# Patient Record
Sex: Male | Born: 1985 | Race: Black or African American | Hispanic: No | Marital: Married | Smoking: Current every day smoker
Health system: Southern US, Community
[De-identification: ages and names within clinical notes are randomized; demographics above are authoritative.]

## PROBLEM LIST (undated history)

## (undated) DIAGNOSIS — R011 Cardiac murmur, unspecified: Secondary | ICD-10-CM

## (undated) DIAGNOSIS — M549 Dorsalgia, unspecified: Secondary | ICD-10-CM

## (undated) DIAGNOSIS — G8929 Other chronic pain: Secondary | ICD-10-CM

---

## 2005-01-16 ENCOUNTER — Ambulatory Visit: Payer: Self-pay | Admitting: Family Medicine

## 2010-01-06 ENCOUNTER — Emergency Department (HOSPITAL_COMMUNITY): Admission: EM | Admit: 2010-01-06 | Discharge: 2010-01-06 | Payer: Self-pay | Admitting: Emergency Medicine

## 2012-02-12 ENCOUNTER — Emergency Department (HOSPITAL_COMMUNITY): Payer: Self-pay

## 2012-02-12 ENCOUNTER — Emergency Department (HOSPITAL_COMMUNITY)
Admission: EM | Admit: 2012-02-12 | Discharge: 2012-02-12 | Disposition: A | Discharge status: Home / Self Care | Payer: Self-pay | Attending: Emergency Medicine | Admitting: Emergency Medicine

## 2012-02-12 ENCOUNTER — Encounter (HOSPITAL_COMMUNITY): Payer: Self-pay | Admitting: *Deleted

## 2012-02-12 DIAGNOSIS — S62306G Unspecified fracture of fifth metacarpal bone, right hand, subsequent encounter for fracture with delayed healing: Secondary | ICD-10-CM

## 2012-02-12 DIAGNOSIS — X838XXS Intentional self-harm by other specified means, sequela: Secondary | ICD-10-CM | POA: Insufficient documentation

## 2012-02-12 DIAGNOSIS — S42309S Unspecified fracture of shaft of humerus, unspecified arm, sequela: Secondary | ICD-10-CM | POA: Insufficient documentation

## 2012-02-12 DIAGNOSIS — M79609 Pain in unspecified limb: Secondary | ICD-10-CM | POA: Insufficient documentation

## 2012-02-12 MED ORDER — HYDROCODONE-ACETAMINOPHEN 5-325 MG PO TABS: 1.0000 | ORAL_TABLET | Freq: Once | ORAL | Status: AC

## 2012-02-12 MED ORDER — HYDROCODONE-ACETAMINOPHEN 5-325 MG PO TABS
1.0000 | ORAL_TABLET | Freq: Once | ORAL | Status: AC
  Administered 2012-02-12: 1 via ORAL
  Filled 2012-02-12: qty 1

## 2012-02-12 NOTE — Discharge Instructions (Signed)
You may use the medicine prescribed for pain,  But do not drive within 4 hours of taking as this will make you sleepy.  Call Dr Lajoyce Corners for definitive treatment of your hand injury.
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
## 2012-02-15 NOTE — ED Provider Notes (Signed)
Evaluation and management procedures were performed by the PA/NP/Resident Physician under my supervision/collaboration.   Felisa Bonier, MD 02/15/12 [phone]

## 2012-03-01 ENCOUNTER — Encounter (HOSPITAL_COMMUNITY): Payer: Self-pay | Admitting: *Deleted

## 2012-03-01 ENCOUNTER — Emergency Department (HOSPITAL_COMMUNITY)
Admission: EM | Admit: 2012-03-01 | Discharge: 2012-03-01 | Disposition: A | Discharge status: Home / Self Care | Payer: Self-pay | Attending: Emergency Medicine | Admitting: Emergency Medicine

## 2012-03-01 DIAGNOSIS — M79609 Pain in unspecified limb: Secondary | ICD-10-CM | POA: Insufficient documentation

## 2012-03-01 DIAGNOSIS — X58XXXS Exposure to other specified factors, sequela: Secondary | ICD-10-CM | POA: Insufficient documentation

## 2012-03-01 DIAGNOSIS — M25449 Effusion, unspecified hand: Secondary | ICD-10-CM | POA: Insufficient documentation

## 2012-03-01 DIAGNOSIS — S62308A Unspecified fracture of other metacarpal bone, initial encounter for closed fracture: Secondary | ICD-10-CM

## 2012-03-01 MED ORDER — IBUPROFEN 800 MG PO TABS: 800.0000 mg | ORAL_TABLET | Freq: Three times a day (TID) | ORAL | Status: AC

## 2012-03-01 MED ORDER — HYDROCODONE-ACETAMINOPHEN 5-325 MG PO TABS: 1.0000 | ORAL_TABLET | ORAL | Status: AC | PRN
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
## 2012-03-01 NOTE — ED Notes (Signed)
Pt seen by EDPa for initial assessment. 

## 2012-03-01 NOTE — ED Notes (Signed)
Rt hand pain for 2 mos , seen here for same.Says it "did not heal right"

## 2012-03-01 NOTE — Discharge Instructions (Signed)
Hand Fracture Your caregiver has diagnosed you with a fractured (broken) bone in your hand. If the bones are in good position and the hand is properly immobilized and rested, these injuries will usually heal in 3 to 6 weeks. A cast, splint, or bulky bandage is usually applied to keep the fracture site from moving. Do not remove the splint or cast until your caregiver approves. If the fracture is unstable or the bones are not aligned properly, surgery may be needed. Keep your hand raised (elevated) above the level of your heart as much as possible for the next 2 to 3 days until the swelling and pain are better. Apply ice packs for 15 to 20 minutes every 3 to 4 hours to help control the pain and swelling. See your caregiver or an orthopedic specialist as directed for follow-up care to make sure the fracture is beginning to heal properly. SEEK IMMEDIATE MEDICAL CARE IF:   You notice your fingers are cold, numb, crooked, or the pain of your injury is severe.   You are not improving or seem to be getting worse.   You have questions or concerns.  Document Released: 12/04/2004 Document Revised: 10/16/2011 Document Reviewed: 04/24/2009 [facility] Patient Information 2012 [place], [place].  Wear the finger splint for comfort. You may use the medication prescribed for your hand pain.  Do not drive within 4 taking this medication.  Plan to see Dr. Lajoyce Corners  on the 29th as scheduled.

## 2012-03-01 NOTE — ED Provider Notes (Signed)
Medical screening examination/treatment/procedure(s) were performed by non-physician practitioner and as supervising physician I was immediately available for consultation/collaboration.  Shelda Jakes, MD 03/01/12 [phone]

## 2012-03-25 ENCOUNTER — Encounter (HOSPITAL_COMMUNITY): Payer: Self-pay | Admitting: *Deleted

## 2012-03-25 ENCOUNTER — Emergency Department (HOSPITAL_COMMUNITY)
Admission: EM | Admit: 2012-03-25 | Discharge: 2012-03-25 | Disposition: A | Discharge status: Home / Self Care | Payer: Self-pay | Attending: Emergency Medicine | Admitting: Emergency Medicine

## 2012-03-25 DIAGNOSIS — M79609 Pain in unspecified limb: Secondary | ICD-10-CM | POA: Insufficient documentation

## 2012-03-25 DIAGNOSIS — G8929 Other chronic pain: Secondary | ICD-10-CM | POA: Insufficient documentation

## 2012-03-25 MED ORDER — HYDROCODONE-ACETAMINOPHEN 5-325 MG PO TABS
2.0000 | ORAL_TABLET | Freq: Once | ORAL | Status: AC
  Administered 2012-03-25: 2 via ORAL
  Filled 2012-03-25: qty 2

## 2012-03-25 MED ORDER — IBUPROFEN 800 MG PO TABS
800.0000 mg | ORAL_TABLET | Freq: Once | ORAL | Status: AC
  Administered 2012-03-25: 800 mg via ORAL
  Filled 2012-03-25: qty 1

## 2012-03-25 MED ORDER — ONDANSETRON HCL 4 MG PO TABS
4.0000 mg | ORAL_TABLET | Freq: Once | ORAL | Status: AC
  Administered 2012-03-25: 4 mg via ORAL
  Filled 2012-03-25: qty 1

## 2012-03-25 NOTE — Discharge Instructions (Signed)
Your vital signs are stable. There no new changes on your examination at this time. Please call the medical social worker here at [facility]  on tomorrow for possible assistance getting to see a specialist. Your may also want to consult the [facility].

## 2012-03-25 NOTE — ED Notes (Signed)
Pt states old injury from October. Tendons were cut and repaired at [facility]. Was suppose to see a surgeon but hasn't been able to due to finances.

## 2012-03-25 NOTE — ED Provider Notes (Signed)
History     CSN: [id number]  Arrival date & time 03/25/12  1634   First MD Initiated Contact with Patient 03/25/12 1746      Chief Complaint  Patient presents with  . Hand Pain    (Consider location/radiation/quality/duration/timing/severity/associated sxs/prior treatment) HPI Comments: Patient states he has a history of a fracture of the right fifth finger of the hand. The area did not heal correctly, and he has chronic pain with this pain. He has a problem flexing and extending the fourth and fifth fingers. He states that he has pain that wakes him up at times. He presents to the emergency department for assistance with his pain. It is of note that the patient was seen twice in the month of April for problems with his pain. He was initially referred to the orthopedic physician here in town, and they referred him to the orthopedic physician in [place] where he was initially seen. The patient states he missed that appointment because of transportation issues. The patient was then referred to Dr. Lajoyce Corners in [place], but he missed this appointment as well. States that he did not have the money to see this particular physician. The patient presents again tonight for assistance with his pain management.  The history is provided by the patient.    History reviewed. No pertinent past medical history.  History reviewed. No pertinent past surgical history.  No family history on file.  History  Substance Use Topics  . Smoking status: Current Everyday Smoker    Types: Cigarettes  . Smokeless tobacco: Not on file  . Alcohol Use: No      Review of Systems  Constitutional: Negative for activity change.       All ROS Neg except as noted in HPI  HENT: Negative for nosebleeds and neck pain.   Eyes: Negative for photophobia and discharge.  Respiratory: Negative for cough, shortness of breath and wheezing.   Cardiovascular: Negative for chest pain and palpitations.  Gastrointestinal:  Negative for abdominal pain and blood in stool.  Genitourinary: Negative for dysuria, frequency and hematuria.  Musculoskeletal: Negative for back pain and arthralgias.       Hand pain  Skin: Negative.   Neurological: Negative for dizziness, seizures and speech difficulty.  Psychiatric/Behavioral: Negative for hallucinations and confusion.    Allergies  Review of patient's allergies indicates no known allergies.  Home Medications   Current Outpatient Rx  Name Route Sig Dispense Refill  . IBUPROFEN 200 MG PO TABS Oral Take 800 mg by mouth daily as needed. For pain      BP 119/70  Pulse 71  Temp(Src) 97.4 F (36.3 C) (Oral)  Resp 18  Ht 5\' 9"  (1.753 m)  Wt 140 lb (63.504 kg)  BMI 20.67 kg/m2  SpO2 100%  Physical Exam  Nursing note and vitals reviewed. Constitutional: He is oriented to person, place, and time. He appears well-developed and well-nourished.  Non-toxic appearance.  HENT:  Head: Normocephalic.  Right Ear: Tympanic membrane and external ear normal.  Left Ear: Tympanic membrane and external ear normal.  Eyes: EOM and lids are normal. Pupils are equal, round, and reactive to light.  Neck: Normal range of motion. Neck supple. Carotid bruit is not present.  Cardiovascular: Normal rate, regular rhythm, normal heart sounds, intact distal pulses and normal pulses.   Pulmonary/Chest: Breath sounds normal. No respiratory distress.  Abdominal: Soft. Bowel sounds are normal. There is no tenderness. There is no guarding.  Musculoskeletal: Normal range of motion.  There is decrease flexion of the right fourth and fifth fingers. The patient particularly has problems making a fist. There is no hot joints noted of the right hand. There is no significant swelling present. There is tenderness to palpation over the fourth and fifth MCP. There is good capillary refill and sensory.  Lymphadenopathy:       Head (right side): No submandibular adenopathy present.       Head (left  side): No submandibular adenopathy present.    He has no cervical adenopathy.  Neurological: He is alert and oriented to person, place, and time. He has normal strength. No cranial nerve deficit or sensory deficit.  Skin: Skin is warm and dry.  Psychiatric: He has a normal mood and affect. His speech is normal.    ED Course  Procedures (including critical care time)  Labs Reviewed - No data to display No results found.   No diagnosis found.    MDM  I have reviewed nursing notes, vital signs, and all appropriate lab and imaging results for this patient. Patient has chronic hand pain related to a bowel healing fracture of the right hand. He has been seen in the emergency department multiple times for problems with pain of his hand. He has been referred to orthopedics in [place], as well as orthopedics in [place] for his pain, but has not been able to make these appointments. The plan at this time. Will be to give the patient. Medication for his pain tonight while here in the emergency department. He's been advised to see the medical social worker to the hospital for assistance to see the consultant. The patient has also been advised to see the [facility] for help with his pain.       Kathie Dike, [place] 03/25/12 1902

## 2012-03-25 NOTE — ED Notes (Signed)
C/o pain to right hand x 1 month.  Seen here for same.  Pt unable to f/u with orthopedic.  C/o pain, out of pain meds.

## 2012-03-25 NOTE — ED Provider Notes (Signed)
Medical screening examination/treatment/procedure(s) were performed by non-physician practitioner and as supervising physician I was immediately available for consultation/collaboration.  Donnetta Hutching, MD 03/25/12 2153

## 2012-08-05 ENCOUNTER — Emergency Department (HOSPITAL_COMMUNITY)
Admission: EM | Admit: 2012-08-05 | Discharge: 2012-08-05 | Disposition: A | Discharge status: Home / Self Care | Payer: Medicaid Other | Attending: Emergency Medicine | Admitting: Emergency Medicine

## 2012-08-05 ENCOUNTER — Encounter (HOSPITAL_COMMUNITY): Payer: Self-pay | Admitting: Emergency Medicine

## 2012-08-05 DIAGNOSIS — F17200 Nicotine dependence, unspecified, uncomplicated: Secondary | ICD-10-CM | POA: Insufficient documentation

## 2012-08-05 DIAGNOSIS — K0889 Other specified disorders of teeth and supporting structures: Secondary | ICD-10-CM

## 2012-08-05 DIAGNOSIS — K029 Dental caries, unspecified: Secondary | ICD-10-CM | POA: Insufficient documentation

## 2012-08-05 MED ORDER — PENICILLIN V POTASSIUM 250 MG PO TABS
500.0000 mg | ORAL_TABLET | Freq: Once | ORAL | Status: AC
  Administered 2012-08-05: 500 mg via ORAL
  Filled 2012-08-05: qty 2

## 2012-08-05 MED ORDER — IBUPROFEN 800 MG PO TABS
800.0000 mg | ORAL_TABLET | Freq: Once | ORAL | Status: AC
  Administered 2012-08-05: 800 mg via ORAL
  Filled 2012-08-05: qty 1

## 2012-08-05 MED ORDER — HYDROCODONE-ACETAMINOPHEN 5-325 MG PO TABS
1.0000 | ORAL_TABLET | Freq: Once | ORAL | Status: AC
  Administered 2012-08-05: 1 via ORAL
  Filled 2012-08-05: qty 1

## 2012-08-05 MED ORDER — HYDROCODONE-ACETAMINOPHEN 5-325 MG PO TABS: 1.0000 | ORAL_TABLET | Freq: Four times a day (QID) | ORAL | Status: AC | PRN

## 2012-08-05 MED ORDER — PENICILLIN V POTASSIUM 500 MG PO TABS: 500.0000 mg | ORAL_TABLET | Freq: Four times a day (QID) | ORAL | Status: AC

## 2012-08-05 NOTE — ED Provider Notes (Signed)
History     CSN: [id number]  Arrival date & time 08/05/12  1539   First MD Initiated Contact with Patient 08/05/12 1614      Chief Complaint  Patient presents with  . Dental Pain    (Consider location/radiation/quality/duration/timing/severity/associated sxs/prior treatment) Patient is a 26 y.o. male presenting with tooth pain. The history is provided by the patient. No language interpreter was used.  Dental PainThe primary symptoms include mouth pain. Episode onset: 2 weeks ago. The symptoms are unchanged. The symptoms occur constantly.  Additional symptoms do not include: purulent gums, trismus and facial swelling.    History reviewed. No pertinent past medical history.  History reviewed. No pertinent past surgical history.  History reviewed. No pertinent family history.  History  Substance Use Topics  . Smoking status: Current Every Day Smoker -- 1.0 packs/day    Types: Cigarettes  . Smokeless tobacco: Not on file  . Alcohol Use: No      Review of Systems  HENT: Positive for dental problem. Negative for facial swelling.   All other systems reviewed and are negative.    Allergies  Review of patient's allergies indicates no known allergies.  Home Medications   Current Outpatient Rx  Name Route Sig Dispense Refill  . HYDROCODONE-ACETAMINOPHEN 5-325 MG PO TABS Oral Take 1 tablet by mouth every 6 (six) hours as needed for pain. 20 tablet 0  . PENICILLIN V POTASSIUM 500 MG PO TABS Oral Take 1 tablet (500 mg total) by mouth 4 (four) times daily. 40 tablet 0    BP 122/73  Pulse 95  Temp 98.6 F (37 C) (Oral)  Resp 20  Ht 5\' 8"  (1.727 m)  Wt 145 lb (65.772 kg)  BMI 22.05 kg/m2  SpO2 100%  Physical Exam  Nursing note and vitals reviewed. Constitutional: He is oriented to person, place, and time. He appears well-developed and well-nourished.  HENT:  Head: Normocephalic and atraumatic.  Mouth/Throat: Uvula is midline, oropharynx is clear and moist and mucous  membranes are normal. Dental caries present. No dental abscesses or uvula swelling.    Eyes: EOM are normal.  Neck: Normal range of motion.  Cardiovascular: Normal rate, regular rhythm, normal heart sounds and intact distal pulses.   Pulmonary/Chest: Effort normal and breath sounds normal. No respiratory distress.  Abdominal: Soft. He exhibits no distension. There is no tenderness.  Musculoskeletal: Normal range of motion.  Neurological: He is alert and oriented to person, place, and time.  Skin: Skin is warm and dry.  Psychiatric: He has a normal mood and affect. Judgment normal.    ED Course  Procedures (including critical care time)  Labs Reviewed - No data to display No results found.   1. Pain, dental       MDM  rx-pen VK 500 mg, 40 rx-hydrocodone ibuprofen        Evalina Field, [place] 08/05/12 1631

## 2012-08-05 NOTE — ED Notes (Signed)
Patient with no complaints at this time. Respirations even and unlabored. Skin warm/dry. Discharge instructions reviewed with patient at this time. Patient given opportunity to voice concerns/ask questions. Patient discharged at this time and left Emergency Department with steady gait.   

## 2012-08-05 NOTE — ED Notes (Signed)
Pt c/o dental pain x 3 days.

## 2012-08-07 NOTE — ED Provider Notes (Signed)
Medical screening examination/treatment/procedure(s) were performed by non-physician practitioner and as supervising physician I was immediately available for consultation/collaboration.  Flint Melter, MD 08/07/12 [phone]

## 2013-02-02 ENCOUNTER — Encounter (HOSPITAL_COMMUNITY): Payer: Self-pay | Admitting: Emergency Medicine

## 2013-02-02 ENCOUNTER — Emergency Department (HOSPITAL_COMMUNITY)
Admission: EM | Admit: 2013-02-02 | Discharge: 2013-02-02 | Disposition: A | Discharge status: Home / Self Care | Payer: Medicaid Other | Attending: Emergency Medicine | Admitting: Emergency Medicine

## 2013-02-02 DIAGNOSIS — F17200 Nicotine dependence, unspecified, uncomplicated: Secondary | ICD-10-CM | POA: Insufficient documentation

## 2013-02-02 DIAGNOSIS — J029 Acute pharyngitis, unspecified: Secondary | ICD-10-CM

## 2013-02-02 DIAGNOSIS — J3489 Other specified disorders of nose and nasal sinuses: Secondary | ICD-10-CM | POA: Insufficient documentation

## 2013-02-02 DIAGNOSIS — R112 Nausea with vomiting, unspecified: Secondary | ICD-10-CM

## 2013-02-02 DIAGNOSIS — R509 Fever, unspecified: Secondary | ICD-10-CM | POA: Insufficient documentation

## 2013-02-02 LAB — RAPID STREP SCREEN (MED CTR MEBANE ONLY): Streptococcus, Group A Screen (Direct): NEGATIVE

## 2013-02-02 MED ORDER — HYDROCODONE-ACETAMINOPHEN 5-325 MG PO TABS: 1.0000 | ORAL_TABLET | ORAL | Status: DC | PRN

## 2013-02-02 MED ORDER — PROMETHAZINE HCL 25 MG PO TABS: 25.0000 mg | ORAL_TABLET | Freq: Four times a day (QID) | ORAL | Status: DC | PRN

## 2013-02-02 MED ORDER — HYDROCODONE-ACETAMINOPHEN 5-325 MG PO TABS
1.0000 | ORAL_TABLET | Freq: Once | ORAL | Status: AC
  Administered 2013-02-02: 1 via ORAL
  Filled 2013-02-02: qty 1

## 2013-02-02 MED ORDER — ONDANSETRON 8 MG PO TBDP
8.0000 mg | ORAL_TABLET | Freq: Once | ORAL | Status: AC
  Administered 2013-02-02: 8 mg via ORAL
  Filled 2013-02-02: qty 1

## 2013-02-02 NOTE — ED Notes (Signed)
Symptoms began yesterday. Motrin last night

## 2013-02-02 NOTE — ED Provider Notes (Signed)
History     CSN: [id number]  Arrival date & time 02/02/13  1100   First MD Initiated Contact with Patient 02/02/13 1108      Chief Complaint  Patient presents with  . Sore Throat  . Fever  . Nasal Congestion    (Consider location/radiation/quality/duration/timing/severity/associated sxs/prior treatment) HPI Timothy Hawkins is a 27 y.o. male who presents to the ED with sore throat. This is a new problem.The sore throat started last night after he vomited x 3. He has not vomited today, has a little nausea. Associated symptoms include fever, chills, aching all over. The history was provided by the patient.    History reviewed. No pertinent past medical history.  History reviewed. No pertinent past surgical history.  History reviewed. No pertinent family history.  History  Substance Use Topics  . Smoking status: Current Every Day Smoker -- 1.00 packs/day    Types: Cigarettes  . Smokeless tobacco: Not on file  . Alcohol Use: No      Review of Systems  Constitutional: Positive for fever and chills.  HENT: Positive for sore throat. Negative for neck pain and neck stiffness.   Respiratory: Negative for cough.   Gastrointestinal: Positive for nausea and vomiting.  Musculoskeletal: Negative for back pain.  Skin: Negative for rash.  Neurological: Negative for dizziness and headaches.  Psychiatric/Behavioral: Negative for confusion. The patient is not nervous/anxious.     Allergies  Review of patient's allergies indicates no known allergies.  Home Medications   Current Outpatient Rx  Name  Route  Sig  Dispense  Refill  . ibuprofen (ADVIL,MOTRIN) 200 MG tablet   Oral   Take 400 mg by mouth every 6 (six) hours as needed for pain.           BP 124/75  Pulse 87  Temp(Src) 98.1 F (36.7 C) (Oral)  Resp 18  Ht 5\' 8"  (1.727 m)  Wt 135 lb (61.236 kg)  BMI 20.53 kg/m2  SpO2 100%  Physical Exam  Nursing note and vitals reviewed. Constitutional: He is oriented to  person, place, and time. He appears well-developed and well-nourished. No distress.  HENT:  Head: Normocephalic.  Right Ear: Tympanic membrane normal.  Left Ear: Tympanic membrane normal.  Nose: Nose normal.  Mouth/Throat: Uvula is midline and mucous membranes are normal. Posterior oropharyngeal erythema present.  Eyes: Conjunctivae and EOM are normal. Pupils are equal, round, and reactive to light.  Neck: Normal range of motion. Neck supple.  Cardiovascular: Normal rate and regular rhythm.   Pulmonary/Chest: Effort normal and breath sounds normal.  Abdominal: Soft. There is no tenderness.  Musculoskeletal: Normal range of motion. He exhibits no edema.  Neurological: He is alert and oriented to person, place, and time. No cranial nerve deficit.  Skin: Skin is warm and dry.  Psychiatric: He has a normal mood and affect. His behavior is normal. Judgment and thought content normal.   Results for orders placed during the hospital encounter of 02/02/13 (from the past 24 hour(s))  RAPID STREP SCREEN     Status: None   Collection Time    02/02/13 11:05 AM      Result Value Range   Streptococcus, Group A Screen (Direct) NEGATIVE  NEGATIVE    Assessment: 27 y.o. male with sore throat   Nausea and vomiting  Plan:  Treat nausea   Tylenol or motrin for fever  Patient feeling better after Zofran.    Will d/c home with medication for nausea and he will take tylenol  or motrin for aching and fever.  Discussed with the patient and all questioned fully answered. He will return if any problems arise.    Medication List    TAKE these medications       HYDROcodone-acetaminophen 5-325 MG per tablet  Commonly known as:  NORCO/VICODIN  Take 1 tablet by mouth every 4 (four) hours as needed.     promethazine 25 MG tablet  Commonly known as:  PHENERGAN  Take 1 tablet (25 mg total) by mouth every 6 (six) hours as needed for nausea.      ASK your doctor about these medications       ibuprofen  200 MG tablet  Commonly known as:  ADVIL,MOTRIN  Take 400 mg by mouth every 6 (six) hours as needed for pain.         ED Course  Procedures      [facility], NP 02/03/13 [phone]

## 2013-02-04 NOTE — ED Provider Notes (Signed)
Medical screening examination/treatment/procedure(s) were performed by non-physician practitioner and as supervising physician I was immediately available for consultation/collaboration.   Lyanne Co, MD 02/04/13 [phone]

## 2013-04-25 ENCOUNTER — Emergency Department (HOSPITAL_COMMUNITY)
Admission: EM | Admit: 2013-04-25 | Discharge: 2013-04-25 | Disposition: A | Discharge status: Home / Self Care | Payer: Medicaid Other | Attending: Emergency Medicine | Admitting: Emergency Medicine

## 2013-04-25 ENCOUNTER — Encounter (HOSPITAL_COMMUNITY): Payer: Self-pay | Admitting: *Deleted

## 2013-04-25 ENCOUNTER — Emergency Department (HOSPITAL_COMMUNITY): Payer: Medicaid Other

## 2013-04-25 DIAGNOSIS — S93401A Sprain of unspecified ligament of right ankle, initial encounter: Secondary | ICD-10-CM

## 2013-04-25 DIAGNOSIS — K029 Dental caries, unspecified: Secondary | ICD-10-CM

## 2013-04-25 DIAGNOSIS — X58XXXA Exposure to other specified factors, initial encounter: Secondary | ICD-10-CM | POA: Insufficient documentation

## 2013-04-25 DIAGNOSIS — Y9367 Activity, basketball: Secondary | ICD-10-CM | POA: Insufficient documentation

## 2013-04-25 DIAGNOSIS — K089 Disorder of teeth and supporting structures, unspecified: Secondary | ICD-10-CM | POA: Insufficient documentation

## 2013-04-25 DIAGNOSIS — Y92838 Other recreation area as the place of occurrence of the external cause: Secondary | ICD-10-CM | POA: Insufficient documentation

## 2013-04-25 DIAGNOSIS — S93409A Sprain of unspecified ligament of unspecified ankle, initial encounter: Secondary | ICD-10-CM | POA: Insufficient documentation

## 2013-04-25 DIAGNOSIS — F17200 Nicotine dependence, unspecified, uncomplicated: Secondary | ICD-10-CM | POA: Insufficient documentation

## 2013-04-25 DIAGNOSIS — Y9239 Other specified sports and athletic area as the place of occurrence of the external cause: Secondary | ICD-10-CM | POA: Insufficient documentation

## 2013-04-25 MED ORDER — IBUPROFEN 600 MG PO TABS: 600.0000 mg | ORAL_TABLET | Freq: Four times a day (QID) | ORAL | Status: DC | PRN

## 2013-04-25 MED ORDER — HYDROCODONE-ACETAMINOPHEN 5-325 MG PO TABS: 1.0000 | ORAL_TABLET | ORAL | Status: DC | PRN

## 2013-04-25 MED ORDER — AMOXICILLIN 500 MG PO CAPS: 500.0000 mg | ORAL_CAPSULE | Freq: Three times a day (TID) | ORAL | Status: DC

## 2013-04-25 NOTE — ED Notes (Signed)
Pt with right upper and left lower jaw pain due to dental problems off and on for weeks per pt.

## 2013-04-25 NOTE — ED Notes (Signed)
Pt reporting "teeth hurt".  Non-specific regarding location. Reports pain in upper and lower jaw on both sides.

## 2013-04-25 NOTE — ED Provider Notes (Signed)
History     CSN: [id number]  Arrival date & time 04/25/13  Paulo Fruit   First MD Initiated Contact with Patient 04/25/13 1952      Chief Complaint  Patient presents with  . Dental Pain    (Consider location/radiation/quality/duration/timing/severity/associated sxs/prior treatment) HPI Timothy Hawkins is a 27 y.o. male who presents tot he ED with dental pain. The pain is located in the upper right and lower left. He has a history of dental decay but has not been able to see a dentist on a regular basis. He also complains of swelling and pain in the right ankle after a fall while playing basketball 3 days ago. The pain is on the inner aspect of the ankle. The history was provided by the patient.    History reviewed. No pertinent past medical history.  History reviewed. No pertinent past surgical history.  History reviewed. No pertinent family history.  History  Substance Use Topics  . Smoking status: Current Every Day Smoker -- 1.00 packs/day    Types: Cigarettes  . Smokeless tobacco: Not on file  . Alcohol Use: No      Review of Systems  Constitutional: Negative for fever and chills.  HENT: Positive for dental problem. Negative for neck pain.   Cardiovascular: Negative for chest pain.  Gastrointestinal: Negative for nausea, vomiting and abdominal pain.  Musculoskeletal:       Right ankle pain  Skin: Wound: abrasion right ankle.  Neurological: Negative for headaches.  Psychiatric/Behavioral: The patient is not nervous/anxious.     Allergies  Review of patient's allergies indicates no known allergies.  Home Medications   Current Outpatient Rx  Name  Route  Sig  Dispense  Refill  . HYDROcodone-acetaminophen (NORCO/VICODIN) 5-325 MG per tablet   Oral   Take 1 tablet by mouth every 4 (four) hours as needed.   10 tablet   0   . ibuprofen (ADVIL,MOTRIN) 200 MG tablet   Oral   Take 400 mg by mouth every 6 (six) hours as needed for pain.         . promethazine  (PHENERGAN) 25 MG tablet   Oral   Take 1 tablet (25 mg total) by mouth every 6 (six) hours as needed for nausea.   20 tablet   0     BP 123/84  Pulse 67  Temp(Src) 98.6 F (37 C) (Oral)  Resp 24  Ht 5\' 8"  (1.727 m)  Wt 135 lb (61.236 kg)  BMI 20.53 kg/m2  SpO2 100%  Physical Exam  Nursing note and vitals reviewed. Constitutional: He is oriented to person, place, and time. He appears well-developed and well-nourished. No distress.  HENT:  Right Ear: Tympanic membrane normal.  Left Ear: Tympanic membrane normal.  Nose: Nose normal.  Mouth/Throat: Uvula is midline, oropharynx is clear and moist and mucous membranes are normal. Dental caries present.    Eyes: EOM are normal.  Neck: Neck supple.  Pulmonary/Chest: Effort normal.  Abdominal: Soft. There is no tenderness.  Musculoskeletal:       Right ankle: He exhibits decreased range of motion and swelling. He exhibits no deformity. Tenderness. Medial malleolus tenderness found. Achilles tendon normal.  Neurological: He is alert and oriented to person, place, and time. No cranial nerve deficit.  Skin: Skin is warm and dry.   Dg Ankle Complete Right  04/25/2013   *RADIOLOGY REPORT*  Clinical Data: Post fall 2 days ago, now with diffuse right ankle swelling  RIGHT ANKLE - COMPLETE 3+ VIEW  Comparison: None.  Findings:  No fracture or dislocation.  The ankle mortise appears preserved. No ankle joint effusion.  Regional soft tissues appear normal.  No radiopaque foreign body.  Possible small plantar calcaneal spur.  IMPRESSION: No fracture or dislocation.   Original Report Authenticated By: Tacey Ruiz, MD     ED Course  Procedures (including critical care time)   MDM  27 y.o. male with dental pain due to caries and right ankle sprain. Will treat with antibiotics and pain medication for the dental problem. Will apply ASO and give patient NSAIDS for the ankle sprain.  I have reviewed this patient's vital signs, nurses notes,  appropriate labs and imaging.  I have discussed findings with the patient and plan of care. Patient voices understanding.     Medication List    STOP taking these medications       promethazine 25 MG tablet  Commonly known as:  PHENERGAN      TAKE these medications       amoxicillin 500 MG capsule  Commonly known as:  AMOXIL  Take 1 capsule (500 mg total) by mouth 3 (three) times daily.     HYDROcodone-acetaminophen 5-325 MG per tablet  Commonly known as:  NORCO/VICODIN  Take 1 tablet by mouth every 4 (four) hours as needed.     ibuprofen 600 MG tablet  Commonly known as:  ADVIL,MOTRIN  Take 1 tablet (600 mg total) by mouth every 6 (six) hours as needed for pain.              [place], [place] 04/25/13 [phone]

## 2013-04-26 NOTE — ED Provider Notes (Signed)
Medical screening examination/treatment/procedure(s) were performed by non-physician practitioner and as supervising physician I was immediately available for consultation/collaboration.    Quintavious Rinck D Korene Dula, MD 04/26/13 1101 

## 2013-06-17 ENCOUNTER — Emergency Department (HOSPITAL_COMMUNITY): Payer: Medicaid Other

## 2013-06-17 ENCOUNTER — Emergency Department (HOSPITAL_COMMUNITY)
Admission: EM | Admit: 2013-06-17 | Discharge: 2013-06-18 | Disposition: A | Discharge status: Home / Self Care | Payer: Medicaid Other | Attending: Emergency Medicine | Admitting: Emergency Medicine

## 2013-06-17 ENCOUNTER — Encounter (HOSPITAL_COMMUNITY): Payer: Self-pay | Admitting: *Deleted

## 2013-06-17 DIAGNOSIS — R011 Cardiac murmur, unspecified: Secondary | ICD-10-CM | POA: Insufficient documentation

## 2013-06-17 DIAGNOSIS — R1032 Left lower quadrant pain: Secondary | ICD-10-CM | POA: Insufficient documentation

## 2013-06-17 DIAGNOSIS — R350 Frequency of micturition: Secondary | ICD-10-CM | POA: Insufficient documentation

## 2013-06-17 DIAGNOSIS — R110 Nausea: Secondary | ICD-10-CM | POA: Insufficient documentation

## 2013-06-17 DIAGNOSIS — F17200 Nicotine dependence, unspecified, uncomplicated: Secondary | ICD-10-CM | POA: Insufficient documentation

## 2013-06-17 DIAGNOSIS — R109 Unspecified abdominal pain: Secondary | ICD-10-CM

## 2013-06-17 DIAGNOSIS — R300 Dysuria: Secondary | ICD-10-CM | POA: Insufficient documentation

## 2013-06-17 HISTORY — DX: Cardiac murmur, unspecified: R01.1

## 2013-06-17 LAB — URINALYSIS, ROUTINE W REFLEX MICROSCOPIC
Bilirubin Urine: NEGATIVE
Ketones, ur: NEGATIVE mg/dL
Leukocytes, UA: NEGATIVE
Nitrite: NEGATIVE
Protein, ur: NEGATIVE mg/dL
Urobilinogen, UA: 0.2 mg/dL (ref 0.0–1.0)

## 2013-06-17 MED ORDER — OXYCODONE-ACETAMINOPHEN 5-325 MG PO TABS
1.0000 | ORAL_TABLET | Freq: Once | ORAL | Status: AC
  Administered 2013-06-17: 1 via ORAL
  Filled 2013-06-17: qty 1

## 2013-06-17 MED ORDER — ONDANSETRON 4 MG PO TBDP
4.0000 mg | ORAL_TABLET | Freq: Once | ORAL | Status: AC
  Administered 2013-06-17: 4 mg via ORAL
  Filled 2013-06-17: qty 1

## 2013-06-17 NOTE — ED Provider Notes (Signed)
CSN: [id number]     Arrival date & time 06/17/13  2155 History     First MD Initiated Contact with Patient 06/17/13 2235     Chief Complaint  Patient presents with  . Back Pain   (Consider location/radiation/quality/duration/timing/severity/associated sxs/prior Treatment) HPI Comments: Timothy Hawkins is a 27 y.o. male who presents to the Emergency Department complaining of left sided low back pain and dysuria.  States he developed back pain one week ago and thought he may have lifted something and injured his back, but states he developed worsening pain and it began to radiate to his left lower abdomen 2-3 days ago.  He also reports nausea and "stinging sensation" when he urinates and dark colored urine.  He denies fever, vomiting, penile discharge or lesions, lower extremity numbness or new sexual partners.  No hx of kidney stones.     Patient is a 27 y.o. male presenting with back pain. The history is provided by the patient.  Back Pain Location:  Lumbar spine Quality:  Aching Radiates to: left lower abdomen. Pain severity:  Moderate Pain is:  Same all the time Onset quality:  Gradual Duration:  1 week Timing:  Constant Progression:  Worsening Chronicity:  New Context: not recent illness and not recent injury   Context comment:  Lifts heavy objects, but denies known injury Relieved by:  Nothing Worsened by:  Ambulation, bending, movement, twisting and urination Ineffective treatments:  None tried Associated symptoms: abdominal pain, abdominal swelling and dysuria   Associated symptoms: no bladder incontinence, no bowel incontinence, no chest pain, no fever, no headaches, no leg pain, no numbness, no paresthesias, no pelvic pain, no perianal numbness, no tingling and no weakness   Associated symptoms comment:  "stinging" when he urinates Abdominal pain:    Location:  LLQ   Quality:  Cramping   Severity:  Mild   Onset quality:  Gradual   Duration:  1 week   Timing:   Constant   Progression:  Worsening   Chronicity:  New Dysuria:    Severity:  Mild   Onset quality:  Gradual   Duration:  2 days   Timing:  Constant   Progression:  Unchanged   Chronicity:  New   Past Medical History  Diagnosis Date  . Heart murmur    History reviewed. No pertinent past surgical history. History reviewed. No pertinent family history. History  Substance Use Topics  . Smoking status: Current Every Day Smoker -- 1.00 packs/day    Types: Cigarettes  . Smokeless tobacco: Not on file  . Alcohol Use: No    Review of Systems  Constitutional: Negative for fever, activity change and appetite change.  Cardiovascular: Negative for chest pain.  Gastrointestinal: Positive for nausea and abdominal pain. Negative for vomiting, constipation, blood in stool and bowel incontinence.  Endocrine: Negative for polyuria.  Genitourinary: Positive for dysuria, frequency and flank pain. Negative for bladder incontinence, hematuria, decreased urine volume, discharge, penile swelling, scrotal swelling, difficulty urinating, penile pain, testicular pain and pelvic pain.  Musculoskeletal: Positive for back pain.  Neurological: Negative for tingling, weakness, numbness, headaches and paresthesias.  All other systems reviewed and are negative.    Allergies  Tylenol  Home Medications   Current Outpatient Rx  Name  Route  Sig  Dispense  Refill  . ibuprofen (ADVIL,MOTRIN) 200 MG tablet   Oral   Take 800 mg by mouth daily as needed for pain.          BP 106/67  Pulse 62  Temp(Src) 98.8 F (37.1 C) (Oral)  Resp 20  Ht 5\' 8"  (1.727 m)  Wt 135 lb (61.236 kg)  BMI 20.53 kg/m2  SpO2 100% Physical Exam  Nursing note and vitals reviewed. Constitutional: He is oriented to person, place, and time. He appears well-developed and well-nourished. No distress.  HENT:  Head: Normocephalic and atraumatic.  Mouth/Throat: Oropharynx is clear and moist.  Neck: Normal range of motion. Neck  supple.  Cardiovascular: Normal rate, regular rhythm, normal heart sounds and intact distal pulses.   No murmur heard. Pulmonary/Chest: Effort normal and breath sounds normal. No respiratory distress. He exhibits no tenderness.  Abdominal: Soft. Normal appearance. He exhibits no distension and no mass. There is no splenomegaly. There is tenderness in the left lower quadrant. There is CVA tenderness. There is no rigidity, no rebound, no guarding and no tenderness at McBurney's point.    Genitourinary: Testes normal and penis normal. Cremasteric reflex is present. Circumcised. No penile erythema. No discharge found.  Musculoskeletal: Normal range of motion. He exhibits tenderness.       Lumbar back: He exhibits tenderness. He exhibits no bony tenderness, no swelling, no edema, no deformity, no laceration, no spasm and normal pulse.       Back:  Left CVA tenderness and ttp of the left lumbar paraspinal muscles.  No spinal tenderness.  DP pulses brisk bilaterally, distal sensation intact.  Neg SLR bilaterally.  Lymphadenopathy:    He has no cervical adenopathy.  Neurological: He is alert and oriented to person, place, and time. He exhibits normal muscle tone. Coordination normal.  Skin: Skin is warm and dry.    ED Course   Procedures (including critical care time)  Results for orders placed during the hospital encounter of 06/17/13  URINALYSIS, ROUTINE W REFLEX MICROSCOPIC      Result Value Range   Color, Urine YELLOW  YELLOW   APPearance CLEAR  CLEAR   Specific Gravity, Urine 1.015  1.005 - 1.030   pH 8.0  5.0 - 8.0   Glucose, UA NEGATIVE  NEGATIVE mg/dL   Hgb urine dipstick NEGATIVE  NEGATIVE   Bilirubin Urine NEGATIVE  NEGATIVE   Ketones, ur NEGATIVE  NEGATIVE mg/dL   Protein, ur NEGATIVE  NEGATIVE mg/dL   Urobilinogen, UA 0.2  0.0 - 1.0 mg/dL   Nitrite NEGATIVE  NEGATIVE   Leukocytes, UA NEGATIVE  NEGATIVE  CBC WITH DIFFERENTIAL      Result Value Range   WBC 4.9  4.0 - 10.5  K/uL   RBC 3.95 (*) 4.22 - 5.81 MIL/uL   Hemoglobin 13.6  13.0 - 17.0 g/dL   HCT 16.1  09.6 - 04.5 %   MCV 99.7  78.0 - 100.0 fL   MCH 34.4 (*) 26.0 - 34.0 pg   MCHC 34.5  30.0 - 36.0 g/dL   RDW 40.9 (*) 81.1 - 91.4 %   Platelets 126 (*) 150 - 400 K/uL   Neutrophils Relative % 43  43 - 77 %   Neutro Abs 2.1  1.7 - 7.7 K/uL   Lymphocytes Relative 47 (*) 12 - 46 %   Lymphs Abs 2.3  0.7 - 4.0 K/uL   Monocytes Relative 7  3 - 12 %   Monocytes Absolute 0.4  0.1 - 1.0 K/uL   Eosinophils Relative 2  0 - 5 %   Eosinophils Absolute 0.1  0.0 - 0.7 K/uL   Basophils Relative 1  0 - 1 %   Basophils Absolute  0.0  0.0 - 0.1 K/uL  BASIC METABOLIC PANEL      Result Value Range   Sodium 139  135 - 145 mEq/L   Potassium 4.1  3.5 - 5.1 mEq/L   Chloride 99  96 - 112 mEq/L   CO2 32  19 - 32 mEq/L   Glucose, Bld 81  70 - 99 mg/dL   BUN 9  6 - 23 mg/dL   Creatinine, Ser 1.61  0.50 - 1.35 mg/dL   Calcium 9.6  8.4 - 09.6 mg/dL   GFR calc non Af Amer >90  >90 mL/min   GFR calc Af Amer >90  >90 mL/min    Ct Abdomen Pelvis Wo Contrast  06/18/2013   *RADIOLOGY REPORT*  Clinical Data: Left flank pain for 1 week.  CT ABDOMEN AND PELVIS WITHOUT CONTRAST  Technique:  Multidetector CT imaging of the abdomen and pelvis was performed following the standard protocol without intravenous contrast.  Comparison: None.  Findings: The lung bases are clear.  There is no pleural or pericardial effusion.  There is a 1 mm calculus in the upper pole of the right kidney, best seen on coronal image number 42.  No other renal calculi are demonstrated.  There is no hydronephrosis or perinephric soft tissue stranding.  Due to a paucity of retroperitoneal fat, the ureters are difficult to trace into the pelvis.  A 5 mm left pelvic calcification on image 68 is favored to reflect a phlebolith over a calculus within a non dilated ureter.  As evaluated in the noncontrast state, the liver, gallbladder, pancreas, spleen and adrenal glands  appear normal.  There is some high-density material within the lumen of the appendix which is mildly prominent.  However, there is no surrounding inflammatory change.  Stool is present throughout the colon.  The bladder is nearly empty and suboptimally evaluated.  There are no acute osseous findings.  IMPRESSION:  1.  Possible tiny right renal calculus. 2.  No hydronephrosis or definite ureteral calculus.  A left pelvic calcification is favored to reflect a phlebolith, although the distal left ureter is not well visualized.  If the patient has persistent unexplained pain and hematuria, follow-up postcontrast imaging may be helpful.   Original Report Authenticated By: Carey Bullocks, M.D.    MDM  GC chlamydia culture pending  patient is feeling better, discussed lab and CT results with the patient. Mild left lower quadrant and CVA tenderness w/o guarding, doubt acute abdomen.  No fever, chills, vomiting or evidence for UTI on the U/A.   I will treat for possible STD with rocephin and Zithromax.  VSS, patient appears stable for discharge.  He agrees to f/u with the health dept or to return here if the sx's worsen  Jake Goodson L. Varetta Chavers, PA-C 06/18/13 0102

## 2013-06-17 NOTE — ED Notes (Signed)
Low back pain for 1 week, No known injury,nausea for 2 days  dysuria

## 2013-06-18 LAB — CBC WITH DIFFERENTIAL/PLATELET
Basophils Absolute: 0 10*3/uL (ref 0.0–0.1)
Basophils Relative: 1 % (ref 0–1)
Eosinophils Absolute: 0.1 10*3/uL (ref 0.0–0.7)
MCH: 34.4 pg — ABNORMAL HIGH (ref 26.0–34.0)
MCHC: 34.5 g/dL (ref 30.0–36.0)
Monocytes Relative: 7 % (ref 3–12)
Neutro Abs: 2.1 10*3/uL (ref 1.7–7.7)
Neutrophils Relative %: 43 % (ref 43–77)
RDW: 11.2 % — ABNORMAL LOW (ref 11.5–15.5)

## 2013-06-18 LAB — BASIC METABOLIC PANEL
Calcium: 9.6 mg/dL (ref 8.4–10.5)
Chloride: 99 mEq/L (ref 96–112)
Creatinine, Ser: 1.04 mg/dL (ref 0.50–1.35)
GFR calc Af Amer: >90 mL/min (ref >90)
GFR calc non Af Amer: >90 mL/min (ref >90)

## 2013-06-18 MED ORDER — CEFTRIAXONE SODIUM 500 MG IJ SOLR
500.0000 mg | Freq: Once | INTRAMUSCULAR | Status: AC
  Administered 2013-06-18: 500 mg via INTRAMUSCULAR
  Filled 2013-06-18: qty 500

## 2013-06-18 MED ORDER — AZITHROMYCIN 250 MG PO TABS
1000.0000 mg | ORAL_TABLET | Freq: Once | ORAL | Status: AC
  Administered 2013-06-18: 1000 mg via ORAL
  Filled 2013-06-18: qty 4

## 2013-06-18 MED ORDER — LIDOCAINE HCL (PF) 1 % IJ SOLN
INTRAMUSCULAR | Status: AC
  Administered 2013-06-18: 02:00:00
  Filled 2013-06-18: qty 5

## 2013-06-18 MED ORDER — HYDROCODONE-ACETAMINOPHEN 5-325 MG PO TABS: ORAL_TABLET | ORAL | Status: DC

## 2013-06-18 MED ORDER — CYCLOBENZAPRINE HCL 10 MG PO TABS: 10.0000 mg | ORAL_TABLET | Freq: Three times a day (TID) | ORAL | Status: DC | PRN

## 2013-06-18 NOTE — ED Provider Notes (Signed)
History/physical exam/procedure(s) were performed by non-physician practitioner and as supervising physician I was immediately available for consultation/collaboration. I have reviewed all notes and am in agreement with care and plan.   Olen Eaves S Reign Bartnick, MD 06/18/13 2000 

## 2013-06-19 LAB — GC/CHLAMYDIA PROBE AMP: CT Probe RNA: NEGATIVE

## 2013-09-30 IMAGING — CT CT ABD-PELV W/O CM
2 of 3 series · 9 of 46 positions shown, 11 images · non-contrast
Comparison: None.

CLINICAL DATA: Left flank pain for 1 week.

CT ABDOMEN AND PELVIS WITHOUT CONTRAST
TECHNIQUE: Multidetector CT imaging of the abdomen and pelvis was
performed following the standard protocol without intravenous
contrast.

[Series 4: mpr coronal (id) · coronal · 0.59mm/px · 8 of 69 slices shown, 9 images]
[im 8/69  soft-tissue]
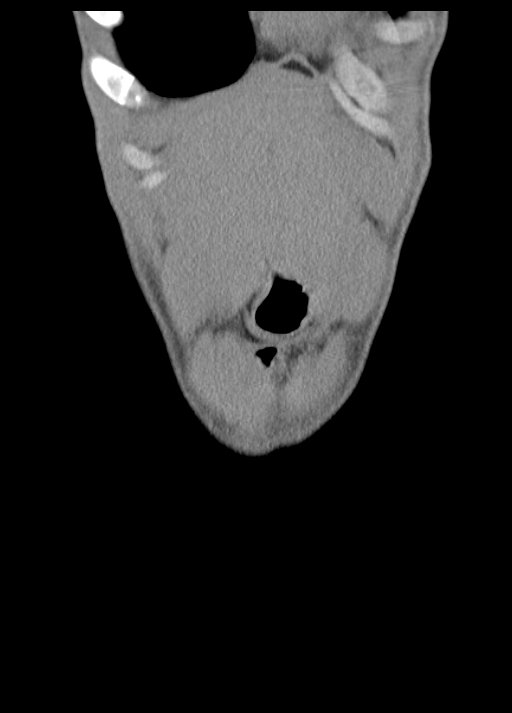
[im 8/69  bone]
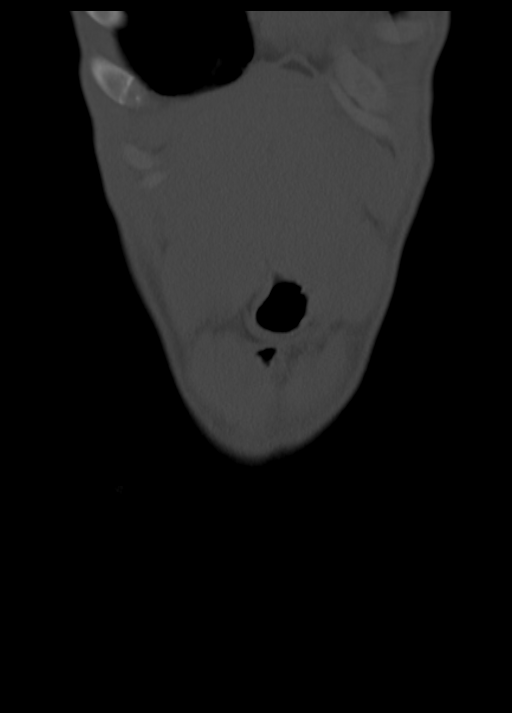
[im 16/69  soft-tissue]
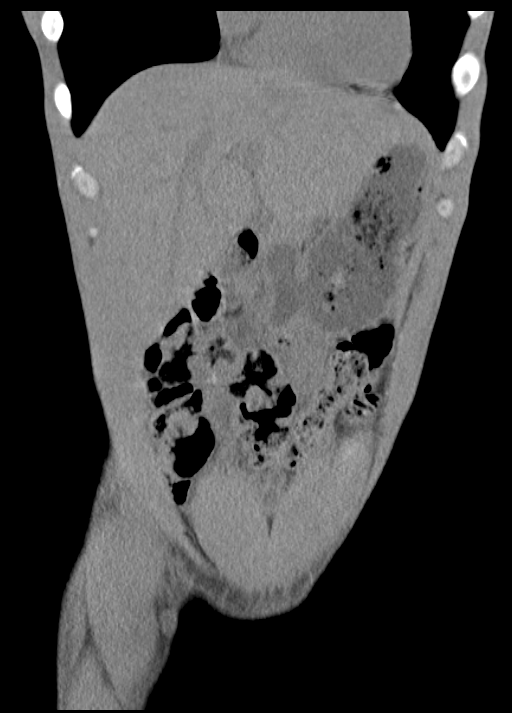
[im 23/69  soft-tissue]
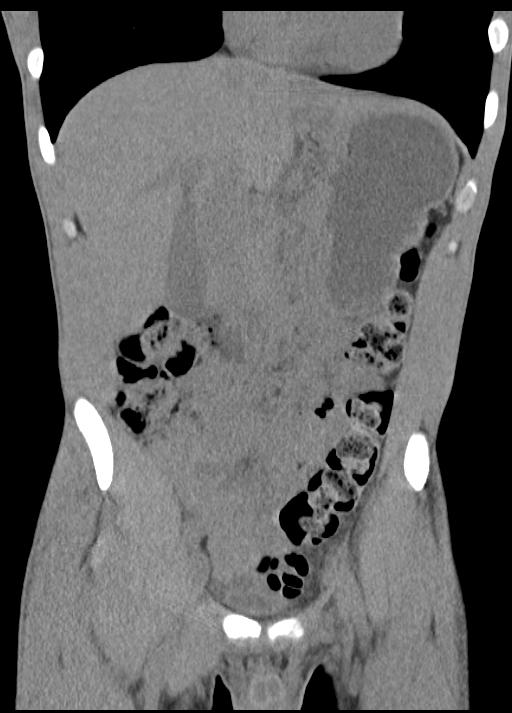
[im 31/69  soft-tissue]
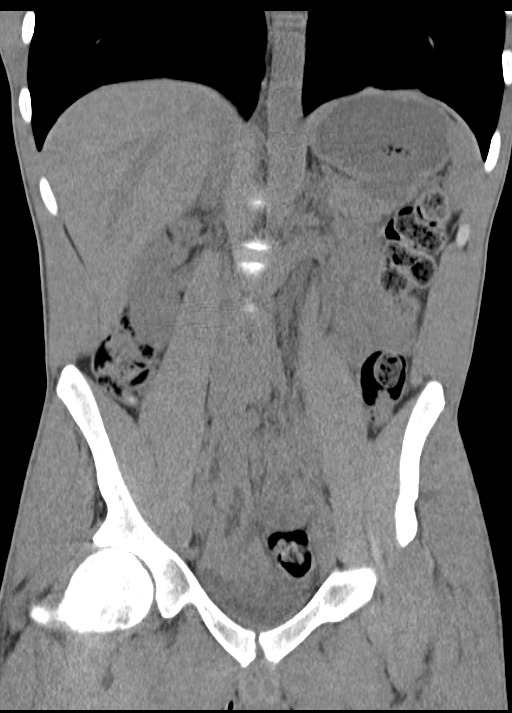
[im 38/69  soft-tissue]
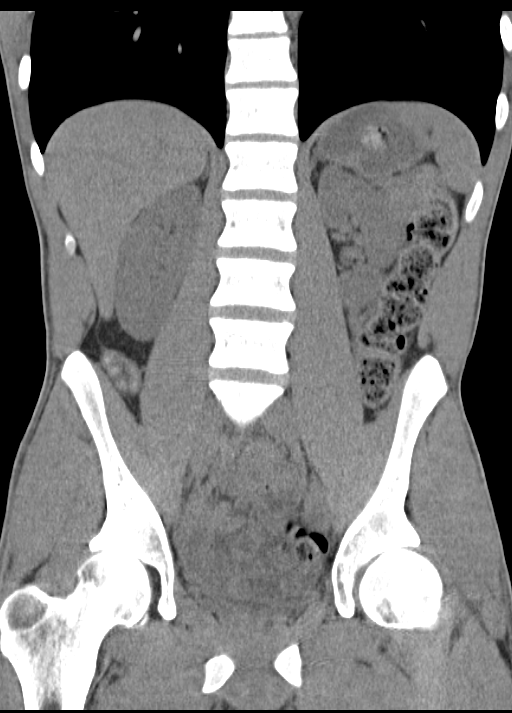
[im 46/69  soft-tissue]
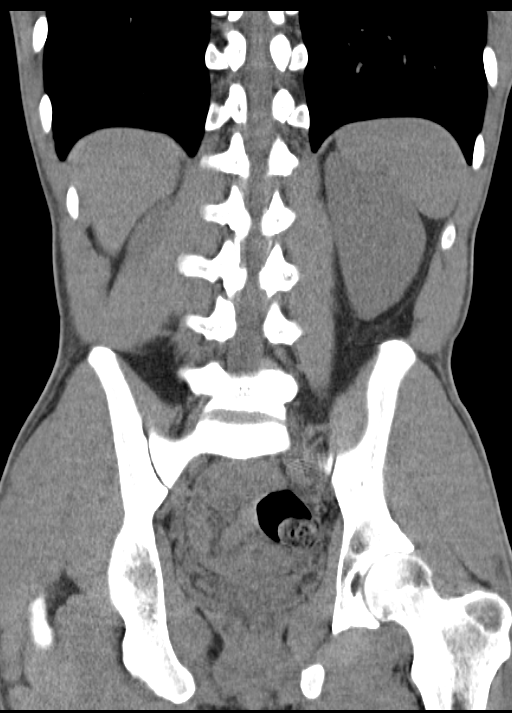
[im 53/69  soft-tissue]
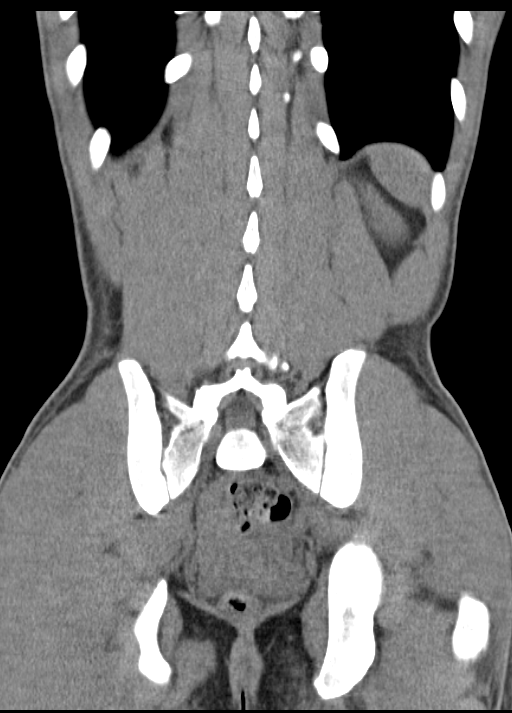
[im 61/69  soft-tissue]
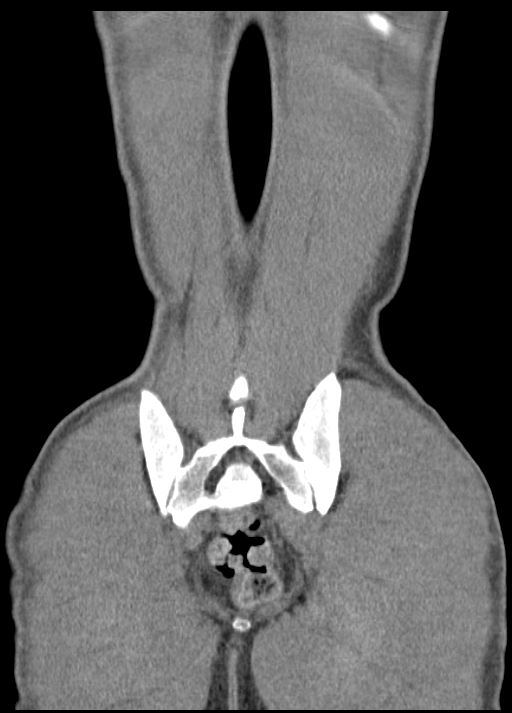

[Series 5: mpr sagittal (id) · sagittal · 0.43mm/px · 1 of 92 slices shown, 2 images]
[im 31/92  soft-tissue]
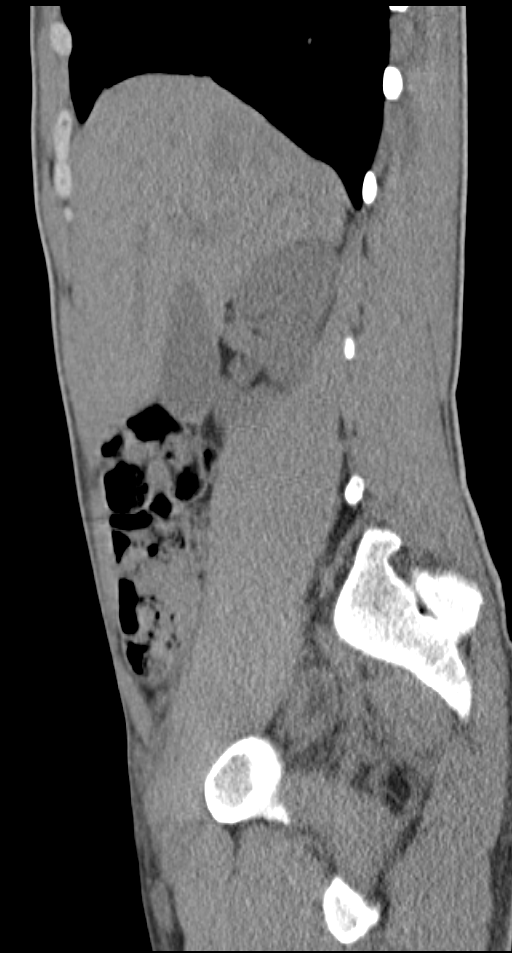
[im 31/92  bone]
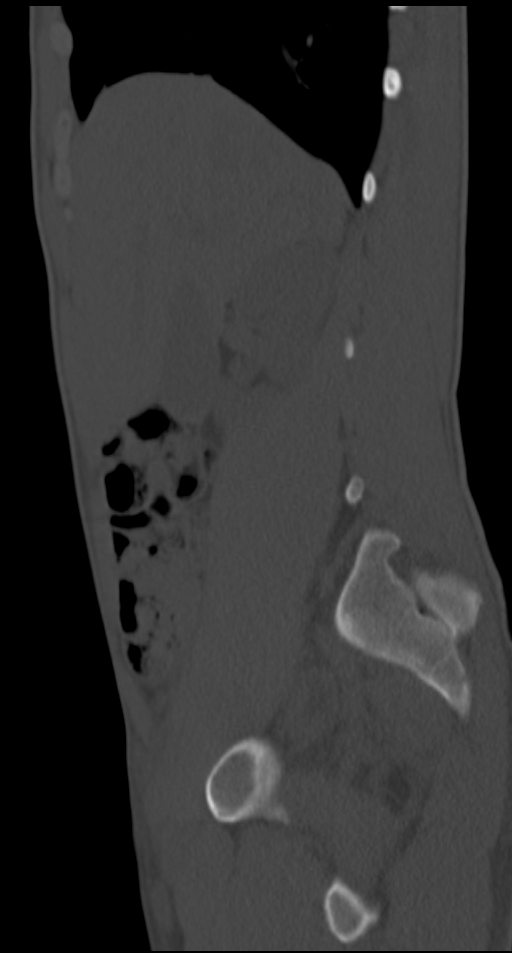

[9 of 46 positions shown; findings below may reference images not displayed]

FINDINGS: The lung bases are clear.  There is no pleural or
pericardial effusion.

There is a 1 mm calculus in the upper pole of the right kidney,
best seen on coronal image number 42.  No other renal calculi are
demonstrated.  There is no hydronephrosis or perinephric soft
tissue stranding.  Due to a paucity of retroperitoneal fat, the
ureters are difficult to trace into the pelvis.  A 5 mm left pelvic
calcification on image 68 is favored to reflect a phlebolith over a
calculus within a non dilated ureter.

As evaluated in the noncontrast state, the liver, gallbladder,
pancreas, spleen and adrenal glands appear normal.  There is some
high-density material within the lumen of the appendix which is
mildly prominent.  However, there is no surrounding inflammatory
change.  Stool is present throughout the colon.  The bladder is
nearly empty and suboptimally evaluated.

There are no acute osseous findings.
IMPRESSION: 1.  Possible tiny right renal calculus.
2.  No hydronephrosis or definite ureteral calculus.  A left pelvic
calcification is favored to reflect a phlebolith, although the
distal left ureter is not well visualized.  If the patient has
persistent unexplained pain and hematuria, follow-up postcontrast
imaging may be helpful.

## 2013-10-07 ENCOUNTER — Encounter (HOSPITAL_COMMUNITY): Payer: Self-pay | Admitting: Emergency Medicine

## 2013-10-07 ENCOUNTER — Emergency Department (HOSPITAL_COMMUNITY)
Admission: EM | Admit: 2013-10-07 | Discharge: 2013-10-07 | Disposition: A | Discharge status: Home / Self Care | Payer: Medicaid Other | Attending: Emergency Medicine | Admitting: Emergency Medicine

## 2013-10-07 ENCOUNTER — Emergency Department (HOSPITAL_COMMUNITY): Payer: Medicaid Other

## 2013-10-07 DIAGNOSIS — Z79899 Other long term (current) drug therapy: Secondary | ICD-10-CM | POA: Insufficient documentation

## 2013-10-07 DIAGNOSIS — IMO0002 Reserved for concepts with insufficient information to code with codable children: Secondary | ICD-10-CM | POA: Insufficient documentation

## 2013-10-07 DIAGNOSIS — F17200 Nicotine dependence, unspecified, uncomplicated: Secondary | ICD-10-CM | POA: Insufficient documentation

## 2013-10-07 DIAGNOSIS — R011 Cardiac murmur, unspecified: Secondary | ICD-10-CM | POA: Insufficient documentation

## 2013-10-07 DIAGNOSIS — G8929 Other chronic pain: Secondary | ICD-10-CM | POA: Insufficient documentation

## 2013-10-07 DIAGNOSIS — M5416 Radiculopathy, lumbar region: Secondary | ICD-10-CM

## 2013-10-07 HISTORY — DX: Other chronic pain: G89.29

## 2013-10-07 HISTORY — DX: Dorsalgia, unspecified: M54.9

## 2013-10-07 MED ORDER — PREDNISONE 50 MG PO TABS
60.0000 mg | ORAL_TABLET | Freq: Once | ORAL | Status: AC
  Administered 2013-10-07: 60 mg via ORAL
  Filled 2013-10-07 (×2): qty 1

## 2013-10-07 MED ORDER — PREDNISONE 10 MG PO TABS: ORAL_TABLET | ORAL | Status: DC

## 2013-10-07 MED ORDER — OXYCODONE-ACETAMINOPHEN 5-325 MG PO TABS
2.0000 | ORAL_TABLET | Freq: Once | ORAL | Status: DC
  Filled 2013-10-07: qty 2

## 2013-10-07 MED ORDER — METHOCARBAMOL 500 MG PO TABS: 500.0000 mg | ORAL_TABLET | Freq: Four times a day (QID) | ORAL | Status: AC

## 2013-10-07 MED ORDER — OXYCODONE HCL 5 MG PO TABS
10.0000 mg | ORAL_TABLET | Freq: Once | ORAL | Status: AC
  Administered 2013-10-07: 10 mg via ORAL
  Filled 2013-10-07: qty 2

## 2013-10-07 MED ORDER — OXYCODONE HCL 5 MG PO TABS: 5.0000 mg | ORAL_TABLET | ORAL | Status: DC | PRN

## 2013-10-07 NOTE — ED Notes (Signed)
Pain lt lower back for 3 weeks, No known injury, denies urinary sx,  Alert, NAD

## 2013-10-07 NOTE — ED Provider Notes (Signed)
CSN: [id number]     Arrival date & time 10/07/13  1215 History   First MD Initiated Contact with Patient 10/07/13 1446     Chief Complaint  Patient presents with  . Back Pain   (Consider location/radiation/quality/duration/timing/severity/associated sxs/prior Treatment) HPI Comments: Timothy Hawkins is a 27 y.o. Male presenting with constant pain in his lower back for the past 3 weeks since lifting a heavy sofa.  He denies a history of low back pain and has been taking ibuprofen 800 mg strength with minimal relief of pain.  The pain is sharp, constant, worse with movement and radiates into his left lower lateral calf. He denies weakness or numbness in his legs and has had no retention or loss of control of bowel or bladder.       The history is provided by the patient.    Past Medical History  Diagnosis Date  . Heart murmur   . Chronic back pain    History reviewed. No pertinent past surgical history. History reviewed. No pertinent family history. History  Substance Use Topics  . Smoking status: Current Every Day Smoker -- 1.00 packs/day    Types: Cigarettes  . Smokeless tobacco: Not on file  . Alcohol Use: No    Review of Systems  Constitutional: Negative for fever.  Respiratory: Negative for shortness of breath.   Cardiovascular: Negative for chest pain and leg swelling.  Gastrointestinal: Negative for abdominal pain, constipation and abdominal distention.  Genitourinary: Negative for dysuria, urgency, frequency, flank pain and difficulty urinating.  Musculoskeletal: Positive for back pain. Negative for gait problem and joint swelling.  Skin: Negative for rash.  Neurological: Negative for weakness and numbness.    Allergies  Tylenol  Home Medications   Current Outpatient Rx  Name  Route  Sig  Dispense  Refill  . ibuprofen (ADVIL,MOTRIN) 200 MG tablet   Oral   Take 400 mg by mouth daily as needed for moderate pain.          . methocarbamol (ROBAXIN) 500  MG tablet   Oral   Take 1 tablet (500 mg total) by mouth 4 (four) times daily.   40 tablet   0   . oxyCODONE (OXY IR/ROXICODONE) 5 MG immediate release tablet   Oral   Take 1 tablet (5 mg total) by mouth every 4 (four) hours as needed for severe pain.   15 tablet   0   . predniSONE (DELTASONE) 10 MG tablet      6, 5, 4, 3, 2 then 1 tablet by mouth daily for 6 days total.   21 tablet   0    BP 116/71  Pulse 76  Temp(Src) 98.2 F (36.8 C) (Oral)  Resp 16  Wt 135 lb (61.236 kg)  SpO2 100% Physical Exam  Nursing note and vitals reviewed. Constitutional: He appears well-developed and well-nourished.  HENT:  Head: Normocephalic.  Eyes: Conjunctivae are normal.  Neck: Normal range of motion. Neck supple.  Cardiovascular: Normal rate and intact distal pulses.   Pulses:      Dorsalis pedis pulses are 2+ on the right side, and 2+ on the left side.  Pedal pulses normal.  Pulmonary/Chest: Effort normal.  Abdominal: Soft. Bowel sounds are normal. He exhibits no distension and no mass.  Musculoskeletal: Normal range of motion. He exhibits no edema.       Lumbar back: He exhibits tenderness and bony tenderness. He exhibits no swelling, no edema, no spasm and normal pulse.  Low lumbar  midline pain radiating across left paralumbar soft tissue.  No deformity or swelling noted. Negative SLR.  Distal sensation intact.  Neurological: He is alert. He has normal strength. He displays no atrophy and no tremor. No sensory deficit. Gait normal.  Reflex Scores:      Patellar reflexes are 2+ on the right side and 2+ on the left side. No strength deficit noted in hip and knee flexor and extensor muscle groups.  Ankle flexion and extension intact.  Skin: Skin is warm and dry.  Psychiatric: He has a normal mood and affect.    ED Course  Procedures (including critical care time) Labs Review Labs Reviewed - No data to display Imaging Review Dg Lumbar Spine Complete  10/07/2013   CLINICAL  DATA:  Left-sided pain  EXAM: LUMBAR SPINE - COMPLETE 4+ VIEW  COMPARISON:  06/18/2013  FINDINGS: Anatomic alignment. Mild narrowing of the L4-5 and L5-S1 discs. No vertebral compression deformity. No pars defect.  IMPRESSION: No acute bony pathology.   Electronically Signed   By: Maryclare Bean M.D.   On: 10/07/2013 17:06    EKG Interpretation   None       MDM   1. Acute lumbar radiculopathy    No neuro deficit on exam or by history to suggest emergent or surgical presentation.  Also discussed worsened sx that should prompt immediate re-evaluation including distal weakness, bowel/bladder retention/incontinence.   Patients labs and/or radiological studies were viewed and considered during the medical decision making and disposition process. Pt with radiculopathy and low back pain, xray suggestive of ddd.  He was prescribed oxycodone, robaxin, prednisone taper.  Encouraged rest, heat tx.  Referral to ortho for further evaluation of sx.       Burgess Amor, PA-C 10/07/13 1733

## 2013-10-07 NOTE — ED Provider Notes (Signed)
Medical screening examination/treatment/procedure(s) were performed by non-physician practitioner and as supervising physician I was immediately available for consultation/collaboration.  EKG Interpretation   None       Devoria Albe, MD, Armando Gang   Ward Givens, MD 10/07/13 2056

## 2013-10-07 NOTE — ED Notes (Signed)
"  pinched nerve". Pt c/o left lower back pain x 3 weeks going down left leg. Pt states did not take anything for pain, rating pain 10. When asked why he didn't take anything if his pain is 10. Pt states I did take ibuprofen but ran out. Nad. No s/s of pain at this time.

## 2013-10-19 ENCOUNTER — Emergency Department (HOSPITAL_COMMUNITY)
Admission: EM | Admit: 2013-10-19 | Discharge: 2013-10-19 | Disposition: A | Discharge status: Home / Self Care | Payer: Medicaid Other | Attending: Emergency Medicine | Admitting: Emergency Medicine

## 2013-10-19 ENCOUNTER — Encounter (HOSPITAL_COMMUNITY): Payer: Self-pay | Admitting: Emergency Medicine

## 2013-10-19 DIAGNOSIS — F17200 Nicotine dependence, unspecified, uncomplicated: Secondary | ICD-10-CM | POA: Insufficient documentation

## 2013-10-19 DIAGNOSIS — M5137 Other intervertebral disc degeneration, lumbosacral region: Secondary | ICD-10-CM | POA: Insufficient documentation

## 2013-10-19 DIAGNOSIS — S335XXA Sprain of ligaments of lumbar spine, initial encounter: Secondary | ICD-10-CM | POA: Insufficient documentation

## 2013-10-19 DIAGNOSIS — M51379 Other intervertebral disc degeneration, lumbosacral region without mention of lumbar back pain or lower extremity pain: Secondary | ICD-10-CM | POA: Insufficient documentation

## 2013-10-19 DIAGNOSIS — R011 Cardiac murmur, unspecified: Secondary | ICD-10-CM | POA: Insufficient documentation

## 2013-10-19 DIAGNOSIS — Y9389 Activity, other specified: Secondary | ICD-10-CM | POA: Insufficient documentation

## 2013-10-19 DIAGNOSIS — M5136 Other intervertebral disc degeneration, lumbar region: Secondary | ICD-10-CM

## 2013-10-19 DIAGNOSIS — S39012A Strain of muscle, fascia and tendon of lower back, initial encounter: Secondary | ICD-10-CM

## 2013-10-19 DIAGNOSIS — X503XXA Overexertion from repetitive movements, initial encounter: Secondary | ICD-10-CM | POA: Insufficient documentation

## 2013-10-19 DIAGNOSIS — Y929 Unspecified place or not applicable: Secondary | ICD-10-CM | POA: Insufficient documentation

## 2013-10-19 MED ORDER — PREDNISONE 10 MG PO TABS: ORAL_TABLET | ORAL | Status: DC

## 2013-10-19 MED ORDER — PREDNISONE 50 MG PO TABS
60.0000 mg | ORAL_TABLET | Freq: Once | ORAL | Status: AC
  Administered 2013-10-19: 60 mg via ORAL
  Filled 2013-10-19 (×2): qty 1

## 2013-10-19 MED ORDER — MELOXICAM 7.5 MG PO TABS: ORAL_TABLET | ORAL | Status: DC

## 2013-10-19 MED ORDER — KETOROLAC TROMETHAMINE 10 MG PO TABS
10.0000 mg | ORAL_TABLET | Freq: Once | ORAL | Status: AC
  Administered 2013-10-19: 10 mg via ORAL
  Filled 2013-10-19: qty 1

## 2013-10-19 MED ORDER — DIAZEPAM 5 MG PO TABS: ORAL_TABLET | ORAL | Status: AC

## 2013-10-19 MED ORDER — DIAZEPAM 5 MG PO TABS
10.0000 mg | ORAL_TABLET | Freq: Once | ORAL | Status: AC
  Administered 2013-10-19: 10 mg via ORAL
  Filled 2013-10-19: qty 2

## 2013-10-19 NOTE — ED Notes (Signed)
Pt co lower back pain that radiates down lt leg.

## 2013-10-19 NOTE — ED Provider Notes (Signed)
CSN: [id number]     Arrival date & time 10/19/13  1357 History   First MD Initiated Contact with Patient 10/19/13 1407     Chief Complaint  Patient presents with  . Back Pain   (Consider location/radiation/quality/duration/timing/severity/associated sxs/prior Treatment) HPI Comments: Patient is a 27 year old male who sustained injury to his lower back and was seen in the emergency department on November 28 at which time an x-ray revealed some evidence is of degenerative disc disease. The examination at that time  did not reveal a surgical emergency. The patient was treated and referred to orthopedics.  The patient states that he does not have adequate transportation, and it is very difficult for him to get to a physician. He states therefore he had not seen the orthopedic specialist yet. The patient's fianc states that he helped someone move a furnace on yesterday, and that he has been rolling and counseling and twisting in the bed since that time. There's been no loss of bowel or bladder function reported. No recent falls, and no foot drop. Review of the patient's medical history reveals a chronic back problem.  The history is provided by the patient.    Past Medical History  Diagnosis Date  . Heart murmur   . Chronic back pain    History reviewed. No pertinent past surgical history. History reviewed. No pertinent family history. History  Substance Use Topics  . Smoking status: Current Every Day Smoker -- 1.00 packs/day    Types: Cigarettes  . Smokeless tobacco: Not on file  . Alcohol Use: No    Review of Systems  Constitutional: Negative for activity change.       All ROS Neg except as noted in HPI  HENT: Negative for nosebleeds.   Eyes: Negative for photophobia and discharge.  Respiratory: Negative for cough, shortness of breath and wheezing.   Cardiovascular: Negative for chest pain and palpitations.  Gastrointestinal: Negative for abdominal pain and blood in stool.   Genitourinary: Negative for dysuria, frequency and hematuria.  Musculoskeletal: Positive for back pain. Negative for arthralgias and neck pain.  Skin: Negative.   Neurological: Negative for dizziness, seizures and speech difficulty.  Psychiatric/Behavioral: Negative for hallucinations and confusion.    Allergies  Tylenol  Home Medications   Current Outpatient Rx  Name  Route  Sig  Dispense  Refill  . ibuprofen (ADVIL,MOTRIN) 200 MG tablet   Oral   Take 400 mg by mouth daily as needed for moderate pain.           BP 118/59  Pulse 105  Temp(Src) 97.9 F (36.6 C) (Oral)  Resp 20  Ht 5\' 8"  (1.727 m)  Wt 135 lb (61.236 kg)  BMI 20.53 kg/m2  SpO2 100% Physical Exam  Nursing note and vitals reviewed. Constitutional: He is oriented to person, place, and time. He appears well-developed and well-nourished.  Non-toxic appearance.  HENT:  Head: Normocephalic.  Right Ear: Tympanic membrane and external ear normal.  Left Ear: Tympanic membrane and external ear normal.  Eyes: EOM and lids are normal. Pupils are equal, round, and reactive to light.  Neck: Normal range of motion. Neck supple. Carotid bruit is not present.  Cardiovascular: Normal rate, regular rhythm, normal heart sounds, intact distal pulses and normal pulses.   Pulmonary/Chest: Breath sounds normal. No respiratory distress.  Abdominal: Soft. Bowel sounds are normal. There is no tenderness. There is no guarding.  Musculoskeletal: Normal range of motion.  There is lower back pain with straight leg raise on  the left at 20. There is pain with change of position.   Lymphadenopathy:       Head (right side): No submandibular adenopathy present.       Head (left side): No submandibular adenopathy present.    He has no cervical adenopathy.  Neurological: He is alert and oriented to person, place, and time. He has normal strength. No cranial nerve deficit or sensory deficit.  Gait is slow but steady. No foot drop. Pt states  the sensation to palpation of the left lower extremity is decreased.  Skin: Skin is warm and dry.  Psychiatric: He has a normal mood and affect. His speech is normal.    ED Course  Procedures (including critical care time) Labs Review Labs Reviewed - No data to display Imaging Review No results found.  EKG Interpretation   None       MDM  No diagnosis found. *I have reviewed nursing notes, vital signs, and all appropriate lab and imaging results for this patient.**  Patient has history of chronic back pain. Was diagnosed on November 28 with degenerative disc changes. The patient has not seen an orthopedic specialist as instructed. The patient's fianc states that he was attempting to lift a furnace and reinjured his back.  Examination today is negative for acute neurologic deficits. The gait is slow but steady. There is no evidence of foot drop. There is no reported loss of bowel or bladder function. Doubt acute surgical emergency.  Patient advised to rest his back is much as possible, use heat to his lower back. Patient also advised to see Dr. Eulah Pont who is orthopedics on call for the most common system. Prescription for diazepam 3 times a day, Mobic twice a day, and prednisone taper given to the patient.   Kathie Dike, PA-C 10/19/13 [phone]

## 2013-10-21 NOTE — ED Provider Notes (Signed)
Medical screening examination/treatment/procedure(s) were performed by non-physician practitioner and as supervising physician I was immediately available for consultation/collaboration.  EKG Interpretation   None         Lyanne Co, MD 10/21/13 2307

## 2014-01-15 ENCOUNTER — Emergency Department (HOSPITAL_COMMUNITY)
Admission: EM | Admit: 2014-01-15 | Discharge: 2014-01-16 | Disposition: A | Discharge status: Home / Self Care | Payer: Medicaid Other | Attending: Emergency Medicine | Admitting: Emergency Medicine

## 2014-01-15 ENCOUNTER — Encounter (HOSPITAL_COMMUNITY): Payer: Self-pay | Admitting: Emergency Medicine

## 2014-01-15 DIAGNOSIS — K029 Dental caries, unspecified: Secondary | ICD-10-CM | POA: Insufficient documentation

## 2014-01-15 DIAGNOSIS — F17200 Nicotine dependence, unspecified, uncomplicated: Secondary | ICD-10-CM | POA: Insufficient documentation

## 2014-01-15 DIAGNOSIS — R011 Cardiac murmur, unspecified: Secondary | ICD-10-CM | POA: Insufficient documentation

## 2014-01-15 DIAGNOSIS — K0889 Other specified disorders of teeth and supporting structures: Secondary | ICD-10-CM

## 2014-01-15 DIAGNOSIS — G8929 Other chronic pain: Secondary | ICD-10-CM | POA: Insufficient documentation

## 2014-01-15 DIAGNOSIS — K089 Disorder of teeth and supporting structures, unspecified: Secondary | ICD-10-CM | POA: Insufficient documentation

## 2014-01-15 NOTE — ED Notes (Signed)
Patient complaining of left lower dental pain x 3 days. 

## 2014-01-16 MED ORDER — IBUPROFEN 800 MG PO TABS: 800.0000 mg | ORAL_TABLET | Freq: Three times a day (TID) | ORAL | Status: AC

## 2014-01-16 MED ORDER — CLINDAMYCIN HCL 150 MG PO CAPS: 150.0000 mg | ORAL_CAPSULE | Freq: Three times a day (TID) | ORAL | Status: DC

## 2014-01-16 MED ORDER — IBUPROFEN 800 MG PO TABS
800.0000 mg | ORAL_TABLET | Freq: Once | ORAL | Status: AC
  Administered 2014-01-16: 800 mg via ORAL
  Filled 2014-01-16: qty 1

## 2014-01-16 MED ORDER — CLINDAMYCIN HCL 150 MG PO CAPS
300.0000 mg | ORAL_CAPSULE | Freq: Once | ORAL | Status: AC
  Administered 2014-01-16: 300 mg via ORAL
  Filled 2014-01-16: qty 2

## 2014-01-16 MED ORDER — PROMETHAZINE HCL 12.5 MG PO TABS
12.5000 mg | ORAL_TABLET | Freq: Once | ORAL | Status: AC
  Administered 2014-01-16: 12.5 mg via ORAL
  Filled 2014-01-16: qty 1

## 2014-01-16 MED ORDER — ACETAMINOPHEN-CODEINE #3 300-30 MG PO TABS: 1.0000 | ORAL_TABLET | Freq: Four times a day (QID) | ORAL | Status: DC | PRN

## 2014-01-16 MED ORDER — ACETAMINOPHEN-CODEINE #3 300-30 MG PO TABS
1.0000 | ORAL_TABLET | Freq: Once | ORAL | Status: AC
  Administered 2014-01-16: 1 via ORAL
  Filled 2014-01-16: qty 1

## 2014-01-16 MED ORDER — PROMETHAZINE HCL 25 MG PO TABS: 25.0000 mg | ORAL_TABLET | Freq: Four times a day (QID) | ORAL | Status: AC | PRN

## 2014-01-16 NOTE — ED Provider Notes (Signed)
Medical screening examination/treatment/procedure(s) were performed by non-physician practitioner and as supervising physician I was immediately available for consultation/collaboration.   EKG Interpretation None        Hanley SeamenJohn L Nicolaos Mitrano, MD 01/16/14 [phone]

## 2014-01-16 NOTE — ED Provider Notes (Signed)
CSN: [id number]     Arrival date & time 01/15/14  2207 History   First MD Initiated Contact with Patient 01/15/14 2315     Chief Complaint  Patient presents with  . Dental Pain     (Consider location/radiation/quality/duration/timing/severity/associated sxs/prior Treatment) Patient is a 28 y.o. male presenting with tooth pain. The history is provided by the patient.  Dental Pain Location:  Lower Quality:  Throbbing Severity:  Moderate Onset quality:  Gradual Duration:  3 days Timing:  Intermittent Progression:  Worsening Chronicity:  Recurrent Context: dental caries and poor dentition   Relieved by:  Nothing Ineffective treatments:  NSAIDs Associated symptoms: gum swelling   Associated symptoms: no fever and no neck pain   Risk factors: smoking   Risk factors: no diabetes     Past Medical History  Diagnosis Date  . Heart murmur   . Chronic back pain    History reviewed. No pertinent past surgical history. History reviewed. No pertinent family history. History  Substance Use Topics  . Smoking status: Current Every Day Smoker -- 1.00 packs/day    Types: Cigarettes  . Smokeless tobacco: Not on file  . Alcohol Use: No    Review of Systems  Constitutional: Negative for fever and activity change.       All ROS Neg except as noted in HPI  HENT: Positive for dental problem. Negative for nosebleeds.   Eyes: Negative for photophobia and discharge.  Respiratory: Negative for cough, shortness of breath and wheezing.   Cardiovascular: Negative for chest pain and palpitations.  Gastrointestinal: Negative for abdominal pain and blood in stool.  Genitourinary: Negative for dysuria, frequency and hematuria.  Musculoskeletal: Negative for arthralgias, back pain and neck pain.  Skin: Negative.   Neurological: Negative for dizziness, seizures and speech difficulty.  Psychiatric/Behavioral: Negative for hallucinations and confusion.      Allergies  Tylenol  Home Medications    Current Outpatient Rx  Name  Route  Sig  Dispense  Refill  . diazepam (VALIUM) 5 MG tablet      1 po tid   15 tablet   0   . ibuprofen (ADVIL,MOTRIN) 200 MG tablet   Oral   Take 400 mg by mouth daily as needed for moderate pain.          . meloxicam (MOBIC) 7.5 MG tablet      1 po bid with food   14 tablet   0   . predniSONE (DELTASONE) 10 MG tablet      5,4,3,2,1 - take with food   15 tablet   0    BP 120/80  Pulse 88  Temp(Src) 97.7 F (36.5 C) (Oral)  Resp 24  Ht 5\' 8"  (1.727 m)  Wt 140 lb (63.504 kg)  BMI 21.29 kg/m2  SpO2 100% Physical Exam  Nursing note and vitals reviewed. Constitutional: He is oriented to person, place, and time. He appears well-developed and well-nourished.  Non-toxic appearance.  HENT:  Head: Normocephalic.  Right Ear: Tympanic membrane and external ear normal.  Left Ear: Tympanic membrane and external ear normal.  Deep cavity of the left lower molar. Mild to mod swelling of the gum. No visible abscess noted. Airway patent. No swelling under the tongue.  Eyes: EOM and lids are normal. Pupils are equal, round, and reactive to light.  Neck: Normal range of motion. Neck supple. Carotid bruit is not present.  Cardiovascular: Normal rate, regular rhythm, normal heart sounds, intact distal pulses and normal pulses.  Pulmonary/Chest: Breath sounds normal. No respiratory distress.  Abdominal: Soft. Bowel sounds are normal. There is no tenderness. There is no guarding.  Musculoskeletal: Normal range of motion.  Lymphadenopathy:       Head (right side): No submandibular adenopathy present.       Head (left side): No submandibular adenopathy present.    He has no cervical adenopathy.  Neurological: He is alert and oriented to person, place, and time. He has normal strength. No cranial nerve deficit or sensory deficit.  Skin: Skin is warm and dry.  Psychiatric: He has a normal mood and affect. His speech is normal.    ED Course   Procedures (including critical care time) Labs Review Labs Reviewed - No data to display Imaging Review No results found.   EKG Interpretation None      MDM Pt has dental pain that started today. No reported fever. Pt states he can not rest today. Exam reveals deep cavity of the left lower molar. No visible abscess. No evidence for Ludwig's Angina. Dental resource given to the patient. Rx for clindamycin, tylenol-codeine, promethazine (pt has nause/vomiting with tylenol) and ibuprofen 800mg . Pt strongly advised to see a dentist as soon as possible.   Final diagnoses:  None    **I have reviewed nursing notes, vital signs, and all appropriate lab and imaging results for this patient.Kathie Dike*    Kashayla Ungerer M Jeriah Skufca, PA-C 01/16/14 [phone]

## 2014-01-16 NOTE — Discharge Instructions (Signed)
Toothache  Toothaches are usually caused by tooth decay (cavity). However, other causes of toothache include:  · Gum disease.  · Cracked tooth.  · Cracked filling.  · Injury.  · Jaw problem (temporo mandibular joint or TMJ disorder).  · Tooth abscess.  · Root sensitivity.  · Grinding.  · Eruption problems.  Swelling and redness around a painful tooth often means you have a dental abscess.  Pain medicine and antibiotics can help reduce symptoms, but you will need to see a dentist within the next few days to have your problem properly evaluated and treated. If tooth decay is the problem, you may need a filling or root canal to save your tooth. If the problem is more severe, your tooth may need to be pulled.  SEEK IMMEDIATE MEDICAL CARE IF:  · You cannot swallow.  · You develop severe swelling, increased redness, or increased pain in your mouth or face.  · You have a fever.  · You cannot open your mouth adequately.  Document Released: 12/04/2004 Document Revised: 01/19/2012 Document Reviewed: 01/24/2010  ExitCare® Patient Information ©2014 [employer].

## 2014-01-22 ENCOUNTER — Encounter (HOSPITAL_COMMUNITY): Payer: Self-pay | Admitting: Emergency Medicine

## 2014-01-22 ENCOUNTER — Emergency Department (HOSPITAL_COMMUNITY)
Admission: EM | Admit: 2014-01-22 | Discharge: 2014-01-22 | Disposition: A | Discharge status: Home / Self Care | Payer: Medicaid Other | Attending: Emergency Medicine | Admitting: Emergency Medicine

## 2014-01-22 DIAGNOSIS — G8929 Other chronic pain: Secondary | ICD-10-CM | POA: Insufficient documentation

## 2014-01-22 DIAGNOSIS — R011 Cardiac murmur, unspecified: Secondary | ICD-10-CM | POA: Insufficient documentation

## 2014-01-22 DIAGNOSIS — R109 Unspecified abdominal pain: Secondary | ICD-10-CM | POA: Insufficient documentation

## 2014-01-22 DIAGNOSIS — K029 Dental caries, unspecified: Secondary | ICD-10-CM | POA: Insufficient documentation

## 2014-01-22 DIAGNOSIS — Z791 Long term (current) use of non-steroidal anti-inflammatories (NSAID): Secondary | ICD-10-CM | POA: Insufficient documentation

## 2014-01-22 DIAGNOSIS — F17200 Nicotine dependence, unspecified, uncomplicated: Secondary | ICD-10-CM | POA: Insufficient documentation

## 2014-01-22 DIAGNOSIS — R112 Nausea with vomiting, unspecified: Secondary | ICD-10-CM | POA: Insufficient documentation

## 2014-01-22 DIAGNOSIS — IMO0002 Reserved for concepts with insufficient information to code with codable children: Secondary | ICD-10-CM | POA: Insufficient documentation

## 2014-01-22 DIAGNOSIS — Z792 Long term (current) use of antibiotics: Secondary | ICD-10-CM | POA: Insufficient documentation

## 2014-01-22 MED ORDER — ACETAMINOPHEN-CODEINE #3 300-30 MG PO TABS: 1.0000 | ORAL_TABLET | Freq: Three times a day (TID) | ORAL | Status: AC | PRN

## 2014-01-22 MED ORDER — ACETAMINOPHEN-CODEINE #3 300-30 MG PO TABS
1.0000 | ORAL_TABLET | Freq: Once | ORAL | Status: AC
  Administered 2014-01-22: 1 via ORAL
  Filled 2014-01-22: qty 1

## 2014-01-22 NOTE — ED Notes (Signed)
Pt given pain medication, noticed that pt was keeping his right hand closed, asked about his right hand, pt opened his right hand, had tablet in his head, advised pt that he would have to take the tablet now or I would have to throw it away, pt states "I have a hard time swallowing tablets", advised pt that I could speak with Dr Jeraldine LootsLockwood about changing his prescription to a liquid form, pt refused states "I will keep what I have",

## 2014-01-22 NOTE — Discharge Instructions (Signed)
As discussed, it is very important that you keep your scheduled appointment with your dentist.    Additional pain medication should come from your dentist or primary care physician.  Return here for concerning changes in your condition.   Dental Caries Dental caries is tooth decay. This decay can cause a hole in teeth (cavity) that can get bigger and deeper over time. HOME CARE  Brush and floss your teeth. Do this at least two times a day.  Use a fluoride toothpaste.  Use a mouth rinse if told by your dentist or doctor.  Eat less sugary and starchy foods. Drink less sugary drinks.  Avoid snacking often on sugary and starchy foods. Avoid sipping often on sugary drinks.  Keep regular checkups and cleanings with your dentist.  Use fluoride supplements if told by your dentist or doctor.  Allow fluoride to be applied to teeth if told by your dentist or doctor. MAKE SURE YOU:  Understand these instructions.  Will watch your condition.  Will get help right away if you are not doing well or get worse. Document Released: 08/05/2008 Document Revised: 06/29/2013 Document Reviewed: 10/29/2012 [facility] Patient Information 2014 [place], [place].  Dental Pain A tooth ache may be caused by cavities (tooth decay). Cavities expose the nerve of the tooth to air and hot or cold temperatures. It may come from an infection or abscess (also called a boil or furuncle) around your tooth. It is also often caused by dental caries (tooth decay). This causes the pain you are having. DIAGNOSIS  Your caregiver can diagnose this problem by exam. TREATMENT   If caused by an infection, it may be treated with medications which kill germs (antibiotics) and pain medications as prescribed by your caregiver. Take medications as directed.  Only take over-the-counter or prescription medicines for pain, discomfort, or fever as directed by your caregiver.  Whether the tooth ache today is caused by infection or  dental disease, you should see your dentist as soon as possible for further care. SEEK MEDICAL CARE IF: The exam and treatment you received today has been provided on an emergency basis only. This is not a substitute for complete medical or dental care. If your problem worsens or new problems (symptoms) appear, and you are unable to meet with your dentist, call or return to this location. SEEK IMMEDIATE MEDICAL CARE IF:   You have a fever.  You develop redness and swelling of your face, jaw, or neck.  You are unable to open your mouth.  You have severe pain uncontrolled by pain medicine. MAKE SURE YOU:   Understand these instructions.  Will watch your condition.  Will get help right away if you are not doing well or get worse. Document Released: 10/27/2005 Document Revised: 01/19/2012 Document Reviewed: 06/14/2008 [facility] Patient Information 2014 [place], [place].

## 2014-01-22 NOTE — ED Provider Notes (Signed)
CSN: [id number]     Arrival date & time 01/22/14  1619 History  This chart was scribed for Gerhard Munchobert Witney Huie, MD by Bennett Scrapehristina Taylor, ED Scribe. This patient was seen in room APA06/APA06 and the patient's care was started at 5:05 PM.    Chief Complaint  Patient presents with  . Dental Pain     The history is provided by the patient. No language interpreter was used.    HPI Comments: Timothy Hawkins is a 28 y.o. male who presents to the Emergency Department complaining of "weeks" of constant left lower dental pain. He reports associated mild abdominal pain, nausea and emesis. He was seen in the ED one week ago for the same and has taken the antibiotics (Clindamycin) with no improvement. He has since run out of the Tylenol-codeine and promethazine. He [place] one [place] for removal/extraction of three teeth. He denies any fevers, CP or syncope. He has a h/o chronic back pain but denies any h/o DM, HTN or MI. He is a current everyday smoker but denies any alcohol use.    Past Medical History  Diagnosis Date  . Heart murmur   . Chronic back pain    History reviewed. No pertinent past surgical history. History reviewed. No pertinent family history. History  Substance Use Topics  . Smoking status: Current Every Day Smoker -- 1.00 packs/day    Types: Cigarettes  . Smokeless tobacco: Not on file  . Alcohol Use: No    Review of Systems  Constitutional:       Per HPI, otherwise negative  HENT:       Per HPI, otherwise negative  Respiratory:       Per HPI, otherwise negative  Cardiovascular:       Per HPI, otherwise negative  Gastrointestinal: Positive for nausea, vomiting and abdominal pain.  Endocrine:       Negative aside from HPI  Genitourinary:       Neg aside from HPI   Musculoskeletal:       Per HPI, otherwise negative  Skin: Negative.   Neurological: Negative for syncope.      Allergies  Tylenol  Home Medications    Current Outpatient Rx  Name  Route  Sig  Dispense  Refill  . acetaminophen-codeine (TYLENOL #3) 300-30 MG per tablet   Oral   Take 1-2 tablets by mouth every 6 (six) hours as needed for moderate pain.   15 tablet   0   . clindamycin (CLEOCIN) 150 MG capsule   Oral   Take 1 capsule (150 mg total) by mouth 3 (three) times daily.   21 capsule   0   . diazepam (VALIUM) 5 MG tablet      1 po tid   15 tablet   0   . ibuprofen (ADVIL,MOTRIN) 200 MG tablet   Oral   Take 400 mg by mouth daily as needed for moderate pain.          Marland Kitchen. ibuprofen (ADVIL,MOTRIN) 800 MG tablet   Oral   Take 1 tablet (800 mg total) by mouth 3 (three) times daily.   21 tablet   0   . meloxicam (MOBIC) 7.5 MG tablet      1 po bid with food   14 tablet   0   . predniSONE (DELTASONE) 10 MG tablet      5,4,3,2,1 - take with food   15 tablet   0   .  promethazine (PHENERGAN) 25 MG tablet   Oral   Take 1 tablet (25 mg total) by mouth every 6 (six) hours as needed for nausea or vomiting.   12 tablet   0    Triage Vitals: BP 104/64  Pulse 77  Temp(Src) 98 F (36.7 C)  Resp 18  Ht 5\' 8"  (1.727 m)  Wt 135 lb (61.236 kg)  BMI 20.53 kg/m2  SpO2 100%  Physical Exam  Nursing note and vitals reviewed. Constitutional: He is oriented to person, place, and time. He appears well-developed and well-nourished. No distress.  HENT:  Head: Normocephalic and atraumatic.  TTP to left submandibular area, no facial asymmetry. Posterior molar on the left is broken, next one over is decayed. 3rd from posterior is broken down to the gumline with roots visible. 4th and 5th teeth posterior to the molar are decayed. 6th one is borderline. No erythema or purulent drainage   Eyes: Conjunctivae and EOM are normal.  Cardiovascular: Normal rate and regular rhythm.   Pulmonary/Chest: Effort normal and breath sounds normal. No stridor. No respiratory distress.  Abdominal: He exhibits no distension.  Musculoskeletal:  Normal range of motion. He exhibits no edema.  Neurological: He is alert and oriented to person, place, and time.  Skin: Skin is warm and dry.  Psychiatric: He has a normal mood and affect. His behavior is normal.    ED Course  Procedures (including critical care time)  DIAGNOSTIC STUDIES: Oxygen Saturation is 100% on RA, normal by my interpretation.    COORDINATION OF CARE: 5:10 PM-Advised pt to stop antibiotics as no signs of infection are noted. Discussed discharge plan which includes pain medication prescription with pt and pt agreed to plan. Also advised pt to follow up with dentist as scheduled and pt agreed. Addressed symptoms to return for with pt.    I reviewed the patient's chart. MDM   I personally performed the services described in this documentation, which was scribed in my presence. The recorded information has been reviewed and is accurate.   This patient presents with ongoing dental pain.  Today, there is no evidence for infection, the compensation, systemic compromise. Patient was encouraged to keep a scheduled dental appointment in one week.  Absent evidence of infection, additional antibiotics are not indicated.  Return precautions, follow up instructions understood by patient and wife.  Gerhard Munch, MD 01/22/14 [phone]

## 2014-01-22 NOTE — ED Notes (Signed)
Pt c/o left lower toothache.
